# Patient Record
Sex: Female | Born: 1995 | Race: Black or African American | Hispanic: No | Marital: Single | Smoking: Current every day smoker
Health system: Southern US, Community
[De-identification: ages and names within clinical notes are randomized; demographics above are authoritative.]

---
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
## 2018-03-16 ENCOUNTER — Encounter (HOSPITAL_BASED_OUTPATIENT_CLINIC_OR_DEPARTMENT_OTHER): Payer: Self-pay | Admitting: *Deleted

## 2018-03-16 ENCOUNTER — Other Ambulatory Visit: Payer: Self-pay

## 2018-03-16 ENCOUNTER — Emergency Department (HOSPITAL_BASED_OUTPATIENT_CLINIC_OR_DEPARTMENT_OTHER)
Admission: EM | Admit: 2018-03-16 | Discharge: 2018-03-16 | Disposition: A | Discharge status: Home / Self Care | Payer: Self-pay | Attending: Emergency Medicine | Admitting: Emergency Medicine

## 2018-03-16 DIAGNOSIS — F17210 Nicotine dependence, cigarettes, uncomplicated: Secondary | ICD-10-CM | POA: Insufficient documentation

## 2018-03-16 DIAGNOSIS — H1132 Conjunctival hemorrhage, left eye: Secondary | ICD-10-CM | POA: Insufficient documentation

## 2018-03-16 MED ORDER — TETRACAINE HCL 0.5 % OP SOLN
2.0000 [drp] | Freq: Once | OPHTHALMIC | Status: AC
  Administered 2018-03-16: 2 [drp] via OPHTHALMIC
  Filled 2018-03-16: qty 4

## 2018-03-16 MED ORDER — FLUORESCEIN SODIUM 1 MG OP STRP
1.0000 | ORAL_STRIP | Freq: Once | OPHTHALMIC | Status: AC
  Administered 2018-03-16: 1 via OPHTHALMIC
  Filled 2018-03-16: qty 1

## 2018-03-16 NOTE — Discharge Instructions (Signed)
Apply cool compresses to your eye for relief. You can take ibuprofen or tylenol as needed for pain. Schedule an appointment with the eye doctor early next week for close follow up. Return to the ED if you develop sudden severe eye pain, vision changes, severe headache, or new or concerning symptoms.
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
## 2018-09-19 ENCOUNTER — Emergency Department (HOSPITAL_BASED_OUTPATIENT_CLINIC_OR_DEPARTMENT_OTHER): Payer: Medicaid Other

## 2018-09-19 ENCOUNTER — Emergency Department (HOSPITAL_BASED_OUTPATIENT_CLINIC_OR_DEPARTMENT_OTHER)
Admission: EM | Admit: 2018-09-19 | Discharge: 2018-09-19 | Disposition: A | Discharge status: Home / Self Care | Payer: Medicaid Other | Attending: Emergency Medicine | Admitting: Emergency Medicine

## 2018-09-19 ENCOUNTER — Other Ambulatory Visit: Payer: Self-pay

## 2018-09-19 ENCOUNTER — Encounter (HOSPITAL_BASED_OUTPATIENT_CLINIC_OR_DEPARTMENT_OTHER): Payer: Self-pay

## 2018-09-19 DIAGNOSIS — Z3A00 Weeks of gestation of pregnancy not specified: Secondary | ICD-10-CM | POA: Insufficient documentation

## 2018-09-19 DIAGNOSIS — Z3490 Encounter for supervision of normal pregnancy, unspecified, unspecified trimester: Secondary | ICD-10-CM

## 2018-09-19 DIAGNOSIS — R8271 Bacteriuria: Secondary | ICD-10-CM

## 2018-09-19 DIAGNOSIS — O9989 Other specified diseases and conditions complicating pregnancy, childbirth and the puerperium: Secondary | ICD-10-CM | POA: Diagnosis present

## 2018-09-19 DIAGNOSIS — O99891 Bacteriuria: Secondary | ICD-10-CM

## 2018-09-19 LAB — URINALYSIS, ROUTINE W REFLEX MICROSCOPIC
Bilirubin Urine: NEGATIVE
Glucose, UA: NEGATIVE mg/dL
Hgb urine dipstick: NEGATIVE
Ketones, ur: NEGATIVE mg/dL
Nitrite: NEGATIVE
Protein, ur: NEGATIVE mg/dL
Specific Gravity, Urine: 1.025 (ref 1.005–1.030)
pH: 6 (ref 5.0–8.0)

## 2018-09-19 LAB — URINALYSIS, MICROSCOPIC (REFLEX)

## 2018-09-19 LAB — PREGNANCY, URINE: Preg Test, Ur: POSITIVE — AB

## 2018-09-19 LAB — HCG, QUANTITATIVE, PREGNANCY: hCG, Beta Chain, Quant, S: 21344 m[IU]/mL — ABNORMAL HIGH (ref <5)

## 2018-09-19 MED ORDER — CEPHALEXIN 500 MG PO CAPS: 500.0000 mg | ORAL_CAPSULE | Freq: Two times a day (BID) | ORAL | 0 refills | Status: AC

## 2018-09-19 MED FILL — CEPHALEXIN 500 MG CAPSULE: 500 | 7 days supply | Qty: 14 | Fill #0

## 2018-09-19 NOTE — Discharge Instructions (Addendum)
Your ultrasound showed a 10 mm yolk sac without embryo.  Your beta hCG was

## 2018-09-19 NOTE — ED Triage Notes (Signed)
Pt c/o lower abd pain, nausea x 3 days-NAD-steady gait

## 2018-09-19 NOTE — ED Provider Notes (Signed)
[facility] EMERGENCY DEPARTMENT Provider Note   CSN: [id number] Arrival date & time: 09/19/18  1120    History   Chief Complaint Chief Complaint  Patient presents with   Abdominal Pain    HPI Gina Perry is a 23 y.o. female.     HPI Patient presents with lower abdominal pain and nausea for around the last 3 days.  States her menses were normally started 2 days ago and she has not had it.  States she is normally regular.  Some mild nausea.  Pain is dull.  No vaginal discharge.  States she could be pregnant.  No diarrhea or constipation.  No lightheadedness or dizziness.  No fevers or chills. History reviewed. No pertinent past medical history.  There are no active problems to display for this patient.   History reviewed. No pertinent surgical history.   OB History    Gravida  1   Para      Term      Preterm      AB      Living        SAB      TAB      Ectopic      Multiple      Live Births               Home Medications    Prior to Admission medications   Medication Sig Start Date End Date Taking? Authorizing Provider  cephALEXin (KEFLEX) 500 MG capsule Take 1 capsule (500 mg total) by mouth 2 (two) times daily. 09/19/18   Benjiman Core, MD  metroNIDAZOLE (FLAGYL) 500 MG tablet Take 1 tablet (500 mg total) by mouth 2 (two) times daily. 09/13/16   Azalia Bilis, MD    Family History No family history on file.  Social History Social History   Tobacco Use   Smoking status: Current Every Day Smoker   Smokeless tobacco: Never Used  Substance Use Topics   Alcohol use: No   Drug use: Yes    Types: Marijuana     Allergies   Patient has no known allergies.   Review of Systems Review of Systems  Constitutional: Negative for appetite change.  Respiratory: Negative for shortness of breath.   Gastrointestinal: Positive for abdominal pain.  Genitourinary: Positive for vaginal bleeding. Negative for decreased urine volume,  flank pain, vaginal discharge and vaginal pain.  Musculoskeletal: Negative for back pain.  Neurological: Negative for weakness.  Psychiatric/Behavioral: Negative for confusion.     Physical Exam Updated Vital Signs BP 120/71 (BP Location: Left Arm)    Pulse 89    Temp 98.2 F (36.8 C) (Oral)    Resp 20    Ht 5\' 2"  (1.575 m)    Wt 56.7 kg    LMP 08/16/2018    SpO2 100%    BMI 22.86 kg/m   Physical Exam Vitals signs and nursing note reviewed. Exam conducted with a chaperone present.  HENT:     Head: Normocephalic.  Cardiovascular:     Rate and Rhythm: Regular rhythm.  Pulmonary:     Breath sounds: Normal breath sounds.  Abdominal:     Tenderness: There is no abdominal tenderness.  Genitourinary:    Comments: Pelvic exam deferred at this time. Skin:    General: Skin is warm.     Capillary Refill: Capillary refill takes less than 2 seconds.  Neurological:     Mental Status: She is alert.      ED Treatments /  Results  Labs (all labs ordered are listed, but only abnormal results are displayed) Labs Reviewed  PREGNANCY, URINE - Abnormal; Notable for the following components:      Result Value   Preg Test, Ur POSITIVE (*)    All other components within normal limits  URINALYSIS, ROUTINE W REFLEX MICROSCOPIC - Abnormal; Notable for the following components:   Leukocytes,Ua TRACE (*)    All other components within normal limits  URINALYSIS, MICROSCOPIC (REFLEX) - Abnormal; Notable for the following components:   Bacteria, UA MANY (*)    All other components within normal limits  HCG, QUANTITATIVE, PREGNANCY - Abnormal; Notable for the following components:   hCG, Beta Chain, Quant, S 21,344 (*)    All other components within normal limits    EKG None  Radiology US Ob Comp < 14 Wks  Result Date: 09/19/2018 CLINICAL DATA:  Lower abdominal pain and nausea for 4 days. Gestational age by LMP of 4 weeks 6 days EXAM: OBSTETRIC <14 WK [place] AND TRANSVAGINAL OB US TECHNIQUE: Both  transabdominal and transvaginal ultrasound examinations were performed for complete evaluation of the gestation as well as the maternal uterus, adnexal regions, and pelvic cul-de-sac. Transvaginal technique was performed to assess early pregnancy. COMPARISON:  None. FINDINGS: Intrauterine gestational sac: Single Yolk sac:  Visualized. Embryo:  Not Visualized. MSD: 10 mm   5 w   5 d Subchorionic hemorrhage:  None visualized. Maternal uterus/adnexae: Both ovaries are normal in appearance. No mass or abnormal free fluid identified. IMPRESSION: Single intrauterine gestational sac measuring 5 weeks 5 days by mean sac diameter. Suggest correlation with serial b-hCG levels, and consider followup ultrasound to assess viability in 10 days. No significant maternal uterine or adnexal abnormality identified. Electronically Signed   By: Myles Rosenthal M.D.   On: 09/19/2018 13:21   US Ob Transvaginal  Result Date: 09/19/2018 CLINICAL DATA:  Lower abdominal pain and nausea for 4 days. Gestational age by LMP of 4 weeks 6 days EXAM: OBSTETRIC <14 WK [place] AND TRANSVAGINAL OB US TECHNIQUE: Both transabdominal and transvaginal ultrasound examinations were performed for complete evaluation of the gestation as well as the maternal uterus, adnexal regions, and pelvic cul-de-sac. Transvaginal technique was performed to assess early pregnancy. COMPARISON:  None. FINDINGS: Intrauterine gestational sac: Single Yolk sac:  Visualized. Embryo:  Not Visualized. MSD: 10 mm   5 w   5 d Subchorionic hemorrhage:  None visualized. Maternal uterus/adnexae: Both ovaries are normal in appearance. No mass or abnormal free fluid identified. IMPRESSION: Single intrauterine gestational sac measuring 5 weeks 5 days by mean sac diameter. Suggest correlation with serial b-hCG levels, and consider followup ultrasound to assess viability in 10 days. No significant maternal uterine or adnexal abnormality identified. Electronically Signed   By: Myles Rosenthal M.D.   On:  09/19/2018 13:21    Procedures Procedures (including critical care time)  Medications Ordered in ED Medications - No data to display   Initial Impression / Assessment and Plan / ED Course  I have reviewed the triage vital signs and the nursing notes.  Pertinent labs & imaging results that were available during my care of the patient were reviewed by me and considered in my medical decision making (see chart for details).        Patient with lower abdominal pain and positive pregnancy test.  Last menses was a month ago.  Benign exam.  No tenderness.  hCG is 21,000.  However ultrasound shows a yolk sac of 10 mm but  no other findings.  I think likely intrauterine pregnancy but unsure about viability at this time.  Sees Dr. Ferdinand CavaArus, who can follow this up.  Will also treat since there is bacteria in the urine without other signs of infection.  Discharge.  Final Clinical Impressions(s) / ED Diagnoses   Final diagnoses:  Pregnancy, unspecified gestational age  Bacteriuria during pregnancy in first trimester    ED Discharge Orders         Ordered    cephALEXin (KEFLEX) 500 MG capsule  2 times daily     09/19/18 1419           Benjiman CorePickering, Amous Crewe, MD 09/19/18 1433

## 2018-12-21 ENCOUNTER — Encounter (HOSPITAL_BASED_OUTPATIENT_CLINIC_OR_DEPARTMENT_OTHER): Payer: Self-pay | Admitting: Adult Health

## 2018-12-21 ENCOUNTER — Emergency Department (HOSPITAL_BASED_OUTPATIENT_CLINIC_OR_DEPARTMENT_OTHER)
Admission: EM | Admit: 2018-12-21 | Discharge: 2018-12-21 | Disposition: A | Discharge status: Home / Self Care | Payer: Medicaid Other | Attending: Emergency Medicine | Admitting: Emergency Medicine

## 2018-12-21 ENCOUNTER — Other Ambulatory Visit: Payer: Self-pay

## 2018-12-21 DIAGNOSIS — F17210 Nicotine dependence, cigarettes, uncomplicated: Secondary | ICD-10-CM | POA: Insufficient documentation

## 2018-12-21 DIAGNOSIS — R52 Pain, unspecified: Secondary | ICD-10-CM | POA: Insufficient documentation

## 2018-12-21 DIAGNOSIS — M25511 Pain in right shoulder: Secondary | ICD-10-CM | POA: Diagnosis present

## 2018-12-21 MED ORDER — NAPROXEN 375 MG PO TABS: 375.0000 mg | ORAL_TABLET | Freq: Two times a day (BID) | ORAL | 0 refills | Status: AC

## 2018-12-21 MED ORDER — CYCLOBENZAPRINE HCL 10 MG PO TABS: 5.0000 mg | ORAL_TABLET | Freq: Two times a day (BID) | ORAL | 0 refills | Status: AC | PRN

## 2018-12-21 NOTE — Discharge Instructions (Addendum)
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
